# Patient Record
Sex: Male | Born: 1980 | Marital: Single | Smoking: Former smoker
Health system: Northeastern US, Community
[De-identification: ages and names within clinical notes are randomized; demographics above are authoritative.]

## PROBLEM LIST (undated history)

## (undated) DIAGNOSIS — H31009 Unspecified chorioretinal scars, unspecified eye: Secondary | ICD-10-CM

## (undated) DIAGNOSIS — H179 Unspecified corneal scar and opacity: Secondary | ICD-10-CM

## (undated) DIAGNOSIS — B5801 Toxoplasma chorioretinitis: Secondary | ICD-10-CM

## (undated) DIAGNOSIS — H3320 Serous retinal detachment, unspecified eye: Secondary | ICD-10-CM

## (undated) DIAGNOSIS — H40059 Ocular hypertension, unspecified eye: Principal | ICD-10-CM

## (undated) HISTORY — DX: Ocular hypertension, unspecified eye: H40.059

## (undated) HISTORY — DX: Unspecified chorioretinal scars, unspecified eye: H31.009

## (undated) HISTORY — PX: RUPTURED GLOBE EXPLORATION AND REPAIR: SHX2366

## (undated) HISTORY — PX: FINGER SURGERY: SHX640

## (undated) HISTORY — DX: Unspecified corneal scar and opacity: H17.9

---

## 2000-11-09 ENCOUNTER — Emergency Department (HOSPITAL_BASED_OUTPATIENT_CLINIC_OR_DEPARTMENT_OTHER): Payer: Self-pay | Admitting: Emergency Medicine

## 2003-11-01 ENCOUNTER — Emergency Department (HOSPITAL_BASED_OUTPATIENT_CLINIC_OR_DEPARTMENT_OTHER): Payer: Self-pay | Admitting: Emergency Medicine

## 2003-11-02 LAB — CT ORBITS WO CONTRAST

## 2003-11-02 LAB — CT CORONAL VIEW-ADDITIONAL VIE

## 2004-03-06 ENCOUNTER — Other Ambulatory Visit: Payer: Self-pay | Admitting: Advanced Practice Midwife

## 2004-03-17 LAB — CYSTIC FIBROSIS DNA ANALYSIS: CYSTIC FIBROSIS DNA ANALYSIS: NEGATIVE

## 2006-08-12 ENCOUNTER — Encounter (HOSPITAL_BASED_OUTPATIENT_CLINIC_OR_DEPARTMENT_OTHER): Payer: Self-pay

## 2009-12-15 ENCOUNTER — Encounter (HOSPITAL_BASED_OUTPATIENT_CLINIC_OR_DEPARTMENT_OTHER): Payer: Self-pay

## 2009-12-15 ENCOUNTER — Ambulatory Visit (HOSPITAL_BASED_OUTPATIENT_CLINIC_OR_DEPARTMENT_OTHER): Payer: Self-pay | Admitting: Ophthalmology

## 2009-12-15 ENCOUNTER — Emergency Department (HOSPITAL_BASED_OUTPATIENT_CLINIC_OR_DEPARTMENT_OTHER)
Admission: RE | Admit: 2009-12-15 | Disposition: A | Discharge status: Home / Self Care | Payer: Self-pay | Source: Emergency Department | Attending: Emergency Medicine | Admitting: Emergency Medicine

## 2009-12-15 DIAGNOSIS — H209 Unspecified iridocyclitis: Secondary | ICD-10-CM

## 2009-12-15 DIAGNOSIS — S0590XA Unspecified injury of unspecified eye and orbit, initial encounter: Secondary | ICD-10-CM

## 2009-12-15 NOTE — ED Notes (Signed)
Pt reports L eye injury 5 years ago requiring surgery, now with pain to that area withblurry vision

## 2009-12-15 NOTE — Nursing Note (Signed)
>>   Sean Chase     Thu Dec 15, 2009  2:50 PM  S/p  Nail injury to OS 5 years ago.    >> [name] Dec 15, 2009  2:23 PM  Here for emergency 3 days ago pain started in OS, pain starts slowly and progresses to (10). +Sensitive to light,red. Pt had surgery 41yrs ago on OS. Had cut inside eye that was repaired surgically.

## 2009-12-15 NOTE — Discharge Instructions (Signed)
Go now to the eye clinic for further evaluation.  Return for any acute problems.

## 2009-12-15 NOTE — Patient Instructions (Signed)
[facility]    Use pred forte one drop every hour while awake.    Use cosopt one drop in the left eye 2 times per day    Use xalatan one drop in the left eye at bedtime    See me tomorrow afternoon.

## 2009-12-15 NOTE — Progress Notes (Signed)
[facility] Sean Chase was seen for a first eye exam.  He has a history of a nail injury/ruptured globe 5 years ago.  He is c.o. Pain in the left eye and light sensitivity.    On exam today he has a healed corneal laceration on the left eye.  There are iridocorneal adhesion.  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  He will start pred forte 1 gtt OS q 1 hours while awake.  His intraocular pressure is elevated likely due to the inflammatory cells clogging the trabecular meshwork.  He will start cosopt 1 gtt OS bid and use xalatan 1 gtt OS qhs.    He has a lesion on his retina that may be a nonpigmented scar.  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    He will rtc tomorrow afternoon.

## 2009-12-16 ENCOUNTER — Ambulatory Visit (HOSPITAL_BASED_OUTPATIENT_CLINIC_OR_DEPARTMENT_OTHER): Payer: Self-pay | Admitting: Ophthalmology

## 2009-12-16 DIAGNOSIS — S0590XA Unspecified injury of unspecified eye and orbit, initial encounter: Secondary | ICD-10-CM

## 2009-12-16 MED ORDER — PREDNISOLONE ACETATE 1 % OP SUSP
1.0000 [drp] | OPHTHALMIC | Status: AC
Start: 2009-12-16 — End: 2010-03-18

## 2009-12-16 NOTE — Nursing Note (Signed)
>>   Sean Chase,OT     Fri Dec 16, 2009  2:17 PM  1 day F/U Iritis OS.  No pain today.  No complains.

## 2009-12-16 NOTE — Progress Notes (Signed)
Sean Chase was seen for follow up of iritis OS.  He has a hx. Of a nail injury/ruptured globe OS.  He feels much better today.  He will continue to use pred forte q 1 hour, cosopt bid, and xalatan qhs.  He will follow up with me in 5-6 days.

## 2009-12-16 NOTE — Patient Instructions (Signed)
Continue to use all three eye drops as directed.

## 2009-12-16 NOTE — Progress Notes (Addendum)
Addended by: Patton Salles on: 12/16/2009      Modules accepted: Orders

## 2009-12-17 LAB — EMERGENCY ROOM NOTE

## 2009-12-22 ENCOUNTER — Ambulatory Visit (HOSPITAL_BASED_OUTPATIENT_CLINIC_OR_DEPARTMENT_OTHER): Payer: Self-pay | Admitting: Ophthalmology

## 2009-12-22 DIAGNOSIS — B5801 Toxoplasma chorioretinitis: Secondary | ICD-10-CM

## 2009-12-22 DIAGNOSIS — S0590XA Unspecified injury of unspecified eye and orbit, initial encounter: Secondary | ICD-10-CM

## 2009-12-22 DIAGNOSIS — H209 Unspecified iridocyclitis: Secondary | ICD-10-CM

## 2009-12-22 NOTE — Progress Notes (Signed)
[facility] Sean Chase was seen for follow up.  He has a history of a ruptured globe OS from a nail injury. On exam today his iritis has worsened despite aggressive topical steroid use.  He now has a vitiritis which blurs the view of the retina somewhat.  The creamy white retinal lesion is still visible.  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    I will refer him to a retina specialist at [facility] for additional evaluation.    His intraocular pressure is elevated due to the inflammatory cells in the A/c.  He will restart cosopt 1 gtt OD bid and xalatan 1 gtt OS qhs.

## 2009-12-22 NOTE — Nursing Note (Signed)
>>   Sean Chase     Thu Dec 22, 2009  3:02 PM  He denies head ache or eye pain    >> Dzenana Idrizovic, OD     Thu Dec 22, 2009  2:37 PM  Here for 6 day f/u on eye trauma OS and iritis from it.   Pt states eye feels a lot better now.     Currently taking Pred forte Q1hr OS only.    Neg eye pain or any other eye complaints.  No photophobia.    Here is the summary of the last notes:    Sean Chase  12/16/09 02:40 PM  Signed  Sula Soda was seen for follow up of iritis OS.  He has a hx. Of a nail injury/ruptured globe OS.  He feels much better today.  He will continue to use pred forte q 1 hour, cosopt bid, and xalatan qhs.  He will follow up with me in 5-6 days.    Sean Chase  12/16/09 02:44 PM  Addended  Addended by: Sean Chase on: 12/16/2009

## 2009-12-28 ENCOUNTER — Encounter (HOSPITAL_BASED_OUTPATIENT_CLINIC_OR_DEPARTMENT_OTHER): Payer: Self-pay

## 2009-12-28 ENCOUNTER — Emergency Department (HOSPITAL_BASED_OUTPATIENT_CLINIC_OR_DEPARTMENT_OTHER)
Admission: RE | Admit: 2009-12-28 | Disposition: A | Discharge status: Home / Self Care | Payer: Self-pay | Source: Emergency Department | Attending: Emergency Medicine | Admitting: Emergency Medicine

## 2009-12-28 LAB — EMERGENCY ROOM NOTE

## 2009-12-28 NOTE — ED Notes (Signed)
C/o pain and seeing circles left eye x two days. Seen in ED for same three weeks ago Told he had high pressure and infection in eye

## 2009-12-28 NOTE — Discharge Instructions (Signed)
Please continue current medications.     Follow up with retinal specialist at [facility] in 2 days as scheduled.

## 2010-03-31 HISTORY — PX: RETINAL DETACHMENT SURGERY: SHX105

## 2010-11-30 DIAGNOSIS — B5801 Toxoplasma chorioretinitis: Secondary | ICD-10-CM | POA: Insufficient documentation

## 2011-03-19 ENCOUNTER — Encounter (HOSPITAL_BASED_OUTPATIENT_CLINIC_OR_DEPARTMENT_OTHER): Payer: Self-pay

## 2011-03-19 ENCOUNTER — Ambulatory Visit (HOSPITAL_BASED_OUTPATIENT_CLINIC_OR_DEPARTMENT_OTHER): Payer: PRIVATE HEALTH INSURANCE

## 2011-03-19 DIAGNOSIS — Z717 Human immunodeficiency virus [HIV] counseling: Secondary | ICD-10-CM

## 2011-03-19 LAB — ICTR HIV POC RAPID: HIV 1 & 2 BY ORAQUICK: NONREACTIVE

## 2011-03-21 LAB — ICTR RPR: RPR: NONREACTIVE

## 2011-03-21 LAB — ICTR CHLAMYDIA/GONORRHEA
CHLAMYDIA NUCLEIC ACID AMPLIFI: NEGATIVE
GONOCOCCUS NUCLEIC ACID AMPLIF: NEGATIVE

## 2011-03-28 NOTE — Progress Notes (Signed)
--  Subjective: History:      Allergies: Review of patient's allergies indicates no known allergies.    Specific reason for testing: Pt has unprotected oral and vaginal sex with male partner.      -Disc:  Transmission (Blood, Semen, Vaginal Fluid and Breastmilk), Risky Behaviors, Non-Risks/Myths,  Seroconversion Period , Result Waiting Time, Possible Results, Feelings Surrounding Possible Results, Plans for Informing of Results, Support Structures and Hep C Risk.     Risk Assessment Completed:  Yes  Piercing/Tattoo: No  Known Partner Risks: NO  IV Drug Use: no    Sexual History: Heterosexual    Current Partner: No    # of partners (Last Year): 3+    Sexual Partners: Female    Sexual Practices: Vaginal, Oral    Condom Use: Sometimes    Condom Breakage: No    Condom Instruction Given: Yes    Risk Reduction Plans: Use condoms more often.    --Objective:    HX of STDs: (dx/tx)  Tested Before: No    DV Assessment Completed: Yes    Last Poss. Exposure: 1 week ago (same partner in the last 2 months)    Protection used: No    Seroconversion Period Explained: Yes    Testing Options Chosen  Readiness to test:   Still want to test?   yes    Patient chose to be tested for: HIV, Chlamydia, GC and Syphilis    Test sent to: State lab    Type of test: Blood, Urine    --Assessment: HIV, HCV, and STI Education and Risk Reduction      --Plan: Education Counseling and Referrals    Informed Consent signed for HIV Testing  STD/HIV risk assessment done  STD/HIV prevention/safer sex discussed  Condoms offered and  Distributed  Confidentiality  Clinic contact number both day and after hours/emergency  Handouts given      Follow Up Plan: Pt tested Non-Reactive for HIV Rapid Test Today.

## 2011-04-09 ENCOUNTER — Ambulatory Visit (HOSPITAL_BASED_OUTPATIENT_CLINIC_OR_DEPARTMENT_OTHER): Payer: Self-pay | Admitting: Ophthalmology

## 2011-04-09 ENCOUNTER — Encounter (HOSPITAL_BASED_OUTPATIENT_CLINIC_OR_DEPARTMENT_OTHER): Payer: Self-pay | Admitting: Internal Medicine

## 2011-04-09 ENCOUNTER — Emergency Department (HOSPITAL_BASED_OUTPATIENT_CLINIC_OR_DEPARTMENT_OTHER)
Admission: RE | Admit: 2011-04-09 | Disposition: A | Discharge status: Home / Self Care | Payer: Self-pay | Source: Emergency Department | Attending: Internal Medicine | Admitting: Internal Medicine

## 2011-04-09 ENCOUNTER — Encounter (HOSPITAL_BASED_OUTPATIENT_CLINIC_OR_DEPARTMENT_OTHER): Payer: Self-pay | Admitting: Ophthalmology

## 2011-04-09 DIAGNOSIS — H179 Unspecified corneal scar and opacity: Secondary | ICD-10-CM

## 2011-04-09 DIAGNOSIS — H31009 Unspecified chorioretinal scars, unspecified eye: Secondary | ICD-10-CM

## 2011-04-09 DIAGNOSIS — S0590XA Unspecified injury of unspecified eye and orbit, initial encounter: Secondary | ICD-10-CM

## 2011-04-09 DIAGNOSIS — H40059 Ocular hypertension, unspecified eye: Secondary | ICD-10-CM

## 2011-04-09 HISTORY — DX: Ocular hypertension, unspecified eye: H40.059

## 2011-04-09 HISTORY — DX: Serous retinal detachment, unspecified eye: H33.20

## 2011-04-09 HISTORY — DX: Unspecified chorioretinal scars, unspecified eye: H31.009

## 2011-04-09 HISTORY — DX: Unspecified corneal scar and opacity: H17.9

## 2011-04-09 MED ORDER — DORZOLAMIDE HCL-TIMOLOL MAL 2-0.5 % OP SOLN
1.00 [drp] | Freq: Two times a day (BID) | OPHTHALMIC | Status: AC
Start: 2011-04-09 — End: 2012-04-08

## 2011-04-09 MED ORDER — BRIMONIDINE TARTRATE 0.15 % OP SOLN
1.00 [drp] | Freq: Two times a day (BID) | OPHTHALMIC | Status: AC
Start: 2011-04-09 — End: 2012-04-08

## 2011-04-09 NOTE — ED Triage Note (Signed)
C/o headache and pain in left eye.  [place].  Vision difficulty since surgery on left eye last August.

## 2011-04-09 NOTE — ED Notes (Signed)
Patient Disposition: Discharge to Ophthalmologist office    Patient education for diagnosis (visual loss), medications (none), activity (as tolerated- no strenuous activity), diet (regular) and follow-up- opthalmology directly from er.  Patient left ED 1:35 PM.  Patient  received written instructions.  Interpreter to provide instructions: Yes- video interpreter    Discharged to: Ophthalmologist office

## 2011-04-09 NOTE — ED Provider Notes (Signed)
The patient was seen primarily by me. ED nursing record was reviewed. Prior records as available electronically through the Epic record were reviewed.    History, physical exam and disposition were conducted with an official hospital [place] interpreter.    HPI:    This 30 year old male patient presents "pain in head and vision change"  Onset 3 weeks ago of blurred vision in left eye.  States his vision becomes dark and then sees white field and no definition.     Denies eye trauma, photophobia, foreign body sensation, contacts, glasses.   Hx of nail injury with ruptured globe, iritis, toxoplasmosis in eye, retinal detachment, al OS.  Not taking any eye drops currently.   States seen at [facility], last visit one year ago.     ROS: Pertinent positives were reviewed as per the HPI above. All other systems were reviewed and are negative.      Past Medical History/Problem list:    Past Medical History    Retinal detachment        Patient Active Problem List:     Trauma to eye [921.9AG]            Past Surgical History: History reviewed.  No pertinent past surgical history.      Medications:   No current facility-administered medications on file prior to encounter.  Current outpatient prescriptions ordered prior to encounter:  dorzolamide-timolol (COSOPT) 22.3-6.8 MG/ML ophthalmic solution One drop in the left eye, twice/day Disp:  Rfl:    latanoprost (XALATAN) 0.005 % ophthalmic solution One drop in the left eye at bedtime Disp:  Rfl:            Social History:   Social History   Marital Status: Single  Spouse Name: N/A    Years of Education: N/A  Number of Children: N/A     Occupational History  None on file     Social History Main Topics   Smoking status: Current Everyday Smoker  0.2 Packs/Day     Smokeless tobacco:     Alcohol Use: Yes    Comment: social    Drug Use: No    Sexually Active:      Other Topics Concern   None on file     Social History Narrative   None on file         Allergies:  Review of  Patient's Allergies indicates:  No Known Allergies      Physical Exam:  BP 140/86   Pulse 71   Temp 98.5 F   Resp 18   Wt 74.844 kg   SpO2 97%    GENERAL:  WDWN, no acute distress, non-toxic   SKIN:  Warm & Dry, no rash, no petechia.  HEAD:  NCAT. Sclerae are anicteric and aninjected, oropharynx is clear with moist mucous membranes. PERRL. EOMI. B TMs clear.  OS Scarring of iris. EOM Intact.   OD WNL.   NECK:  No C-spine tenderness, crepitus, step-off.  No meningismus.  No LAN. No stridor.  LUNGS:  Clear to auscultation bilaterally. No wheezes, rales, rhonchi.   HEART:  RRR.  No murmurs, rubs, or gallops.   ABDOMEN:  Soft, NTND.  No hepatosplenomegaly.  No masses.  No involuntary guarding or rebound.   EXTREMITIES:  No obvious deformities.  Warm and well perfused.  No cyanosis, clubbing, or edema.   GENITOURINARY:  No CVA tenderness.   NEUROLOGIC:  Alert and oriented x4; moves all extremities well; speaking  in clear fluent sentences. Normal gait without ataxia; nonfocal. CNsII-XII symmetrical and intact. Sensation intact to light touch throughout. 5/5 strength globally.  PSYCHIATRIC:  Appropriate for age, time of day, and situation        ED Course and Medical Decision-making:  30 year old male with hx of traumatic injury to left eye, iritis, retinal detachment and toxoplasmosis.   Decreasing vision in left eye over past month.   OS vision change here in ED concerning for acute vision loss.   Appropriate for him to have an urgent eye evaluation today in the eye clinic.       Reasons to return to the ED were reviewed in detail. The patient agrees with this plan and disposition.      Condition on Discharge: Improved and Stable        Diagnosis/Diagnoses:  Acute Visual Loss

## 2011-04-09 NOTE — ED Notes (Signed)
Visual acuity:  Left eye- pt states all he can see is white.  No letters.  Right eye 20/20 -1.  Both eyes 20/20.

## 2011-04-09 NOTE — Progress Notes (Signed)
Unfortunate 30 year old male, with history of trauma OS, ruptured globe, 6 years ago, with limited vision OS, but developed separate problem one year ago, ocular toxoplasmosis OS, chorioretinitis, apparently developed retinal detachment. He had retinal detachment surgery, presumably with silicone oil at [facility], but didn't follow up. He now has pain, and very little vision, and high intraocular pressure OS. There is silicone oil layered superiorly in anterior chamber, and in vitreous. He needs to see retina specialist at [facility] to consider removal of silicone oil. Also start cosopt one drop, left eye, twice a day, and alphagan, one drop, twice a day.     Corneal scar, OD. He had additional injury OD playing soccer. No specific therapy, but should use safety glasses.

## 2011-04-09 NOTE — Nursing Note (Signed)
>>   ELANA [facility]     Mon Apr 09, 2011  2:10 PM  Pt here for emergency. Sent immediately from ED.  C/o Pain in eye (rated as 5) "pain comes and goes.", OS X 1 day. Head also hurts. Blurry VA OS as well.  (+) Floaters OS  (-)Flashes    H/O: nail injury/ruptured globe 5 years ago, had Sx to correct it.  F/u iritis, corneal laceration & scar OS.

## 2011-04-25 ENCOUNTER — Encounter (HOSPITAL_BASED_OUTPATIENT_CLINIC_OR_DEPARTMENT_OTHER): Payer: Self-pay

## 2011-04-25 DIAGNOSIS — F17200 Nicotine dependence, unspecified, uncomplicated: Secondary | ICD-10-CM | POA: Insufficient documentation

## 2012-05-20 ENCOUNTER — Other Ambulatory Visit (HOSPITAL_BASED_OUTPATIENT_CLINIC_OR_DEPARTMENT_OTHER): Payer: Self-pay | Admitting: Ophthalmology

## 2012-05-22 ENCOUNTER — Telehealth (HOSPITAL_BASED_OUTPATIENT_CLINIC_OR_DEPARTMENT_OTHER): Payer: Self-pay

## 2012-05-26 ENCOUNTER — Encounter (HOSPITAL_BASED_OUTPATIENT_CLINIC_OR_DEPARTMENT_OTHER): Payer: Self-pay | Admitting: Emergency Medicine

## 2012-05-26 ENCOUNTER — Emergency Department (HOSPITAL_BASED_OUTPATIENT_CLINIC_OR_DEPARTMENT_OTHER)
Admission: RE | Admit: 2012-05-26 | Disposition: A | Discharge status: Home / Self Care | Payer: Self-pay | Source: Emergency Department | Attending: Emergency Medicine | Admitting: Emergency Medicine

## 2012-05-26 HISTORY — DX: Toxoplasma chorioretinitis: B58.01

## 2012-05-26 LAB — SCREENING TEST VISUAL ACUITY QUANTITATIVE BILAT

## 2012-05-26 MED ORDER — TETRACAINE HCL 0.5 % OP SOLN
1.0000 [drp] | Freq: Once | OPHTHALMIC | Status: DC
Start: 2012-05-26 — End: 2012-05-26

## 2012-05-26 MED ORDER — BRIMONIDINE TARTRATE 0.15 % OP SOLN
1.0000 [drp] | Freq: Three times a day (TID) | OPHTHALMIC | Status: DC
Start: 2012-05-26 — End: 2012-05-26

## 2012-05-26 MED ORDER — DORZOLAMIDE HCL-TIMOLOL MAL 2-0.5 % OP SOLN
1.00 [drp] | Freq: Two times a day (BID) | OPHTHALMIC | Status: AC
Start: 2012-05-26 — End: 2012-06-02

## 2012-05-26 MED ORDER — TIMOLOL MALEATE 0.5 % OP SOLN
1.00 [drp] | Freq: Once | OPHTHALMIC | Status: AC
Start: 2012-05-26 — End: 2012-05-26
  Administered 2012-05-26: 1 [drp] via OPHTHALMIC
  Filled 2012-05-26: qty 5

## 2012-05-26 MED ORDER — ACETAZOLAMIDE ER 500 MG PO CP12
500.0000 mg | ORAL_CAPSULE | Freq: Once | ORAL | Status: DC
Start: 2012-05-26 — End: 2012-05-26

## 2012-05-26 MED ORDER — BRIMONIDINE TARTRATE 0.15 % OP SOLN
1.00 [drp] | Freq: Three times a day (TID) | OPHTHALMIC | Status: AC
Start: 2012-05-26 — End: 2012-06-02

## 2012-05-26 MED ORDER — DORZOLAMIDE HCL-TIMOLOL MAL 2-0.5 % OP SOLN
1.0000 [drp] | Freq: Two times a day (BID) | OPHTHALMIC | Status: DC
Start: 2012-05-26 — End: 2012-05-26

## 2012-05-26 MED ORDER — TETRACAINE HCL 0.5 % OP SOLN
1.00 [drp] | Freq: Once | OPHTHALMIC | Status: AC
Start: 2012-05-26 — End: 2012-05-26
  Administered 2012-05-26: 1 [drp] via OPHTHALMIC
  Filled 2012-05-26: qty 15

## 2012-05-26 NOTE — ED Notes (Signed)
Patient Disposition    Patient education for diagnosis, medications, activity, diet and follow-up.  Patient left ED 7:27 PM.  Patient rep received written instructions.  Interpreter to provide instructions: Yes    Discharged to: Discharged to home. Patient given extensive verbal and written instruction to follow up with [facility] tomorrow morning.

## 2012-05-26 NOTE — ED Provider Notes (Signed)
The patient was seen by me and supervised by Dr. Max Fickle. ED nursing record was reviewed. Prior records as available electronically through the Epic record were reviewed.    History, physical exam and disposition were conducted with an official hospital [place] interpreter.    HPI:    Sean Chase is a 31 year old male w/ a PMH of toxoplasmosis chorioretinitis of the L eye p/w L eye redness and associated headache. The pt states that he ran out of his two opthalmic solutions today and that his L eye has become red and painful and he has developed a L sided headache. He denies any N/V/D, lightheadeness, visual disturbances, or neck pain. He has no weakness or numbness anywhere. He states that this happens whenever he runs out of his opthalmic medications and that he would like to have them refilled.      ROS: Pertinent positives were reviewed as per the HPI above. All other systems were reviewed and are negative.      Past Medical History/Problem list:    Past Medical History    Toxoplasmosis chorioretinitis of left eye      There is no problem list on file for this patient.        Past Surgical History: No past surgical history on file.      Medications:   No current facility-administered medications on file prior to encounter.  No current outpatient prescriptions on file prior to encounter.      Social History:   Social History   Marital Status: Single  Spouse Name: N/A    Years of Education: N/A  Number of Children: N/A     Occupational History  None on file     Social History Main Topics   Smoking status: Not on file    Smokeless tobacco:     Alcohol Use: Not on file    Drug Use: Not on file    Sexually Active: Not on file     Other Topics Concern   None on file     Social History Narrative   None on file     The patient lives at home. The patient is currently employed.      Allergies:  Review of Patient's Allergies indicates:  No Known Allergies      Physical Exam:  BP 149/80  Pulse 74  Temp(Src)  98.4 F  Resp 14  Wt 81.647 kg  SpO2 100%    GENERAL:  WDWN, no acute distress, non-toxic   SKIN:  Warm & Dry, no rash, no petechia.  HEAD:  NCAT. Sclerae are anicteric and aninjected, oropharynx is clear with moist mucous membranes. PERRL. R eye is normal w/ visual acuity of 30/20, L eye is injected w/ opacity behind the lens, w/ no visual acuity. No discharge from either eye. EOMI. B TMs clear.  NECK:  No C-spine tenderness, crepitus, step-off.  No meningismus.  No LAN. No stridor.  LUNGS:  Clear to auscultation bilaterally. No wheezes, rales, rhonchi.   HEART:  RRR.  No murmurs, rubs, or gallops.   ABDOMEN:  Soft, NTND.  No hepatosplenomegaly.  No masses.  No involuntary guarding or rebound.   EXTREMITIES:  No obvious deformities.  Warm and well perfused.  No cyanosis, clubbing, or edema.   GENITOURINARY:  No CVA tenderness.   NEUROLOGIC:  Alert and oriented x4; moves all extremities well; speaking in clear fluent sentences. Normal gait without ataxia; nonfocal. CNsII-XII symmetrical and intact. Sensation intact to light touch  throughout. 5/5 strength globally.  PSYCHIATRIC:  Appropriate for age, time of day, and situation      ED Course and Medical Decision-making:    Sean Chase is a 31 year old male w/ a PMH of toxoplasmosis chorioretinitis p/w 1 day of L eye pain and headache. The pt's history and physical was very concerning for acute angle glaucoma, and ocular tonometry was performed w/ a pressure of 20 mmHg in the R eye and 44 mmHg in the left. Visual acuity testing revealed an acuity of 30/20 in the R eye, and no vision in the left. His opthomologist at [facility] was contacted and an appointment was set up for tomorrow morning. He was also written for a refill of his opthalmic medications.    Reasons to return to the ED were reviewed in detail. The patient agrees with this plan and disposition.      Condition on Discharge: Improved and Stable  Condition on Admission:  Stable      Diagnosis/Diagnoses:  No diagnosis found.        =======================    Farrel Conners, MD  PGY 1, TY Intern, Pager: [phone]  5:39 PM 05/26/2012

## 2012-05-26 NOTE — ED Notes (Signed)
MD at bedside to test eye pressure.

## 2012-05-26 NOTE — ED Provider Notes (Signed)
Attending Note    This patient was seen with the resident.  I agree with the notes, the physical exam, the disposition and plan.  I oversaw the patient along with the resident.      The patient comes to the emergency department is a 31 year old male with a history of toxoplasmosis induced retinal vasculitis 4 years ago.  He has been on timolol and alphagan drops prescribed by Dr. Nathaneil Canary per the patient.  He was last seen in [facility] ophthalmology one year ago he states, by Dr. Nathaneil Canary.  He has not seen an ophthalmologist since that time.  He states that he's been having increasing pain, corneal clouding, and decreased vision of the left eye, though states he has extremely poor vision at baseline.  He denies any injury to the eye.  He states is also a headache associated with the pain.  No fevers or chills.  He attempted to contact ophthalmology however, he states he was on hold for too long and he decided to go to the emergency department.    Exam reveals a well-appearing male, no acute distress.  Left eye is injected, no chemosis.  Extraocular motions intact.  Pupils equal and reactive bilaterally.  There is some corneal clouding appreciated.  No periorbital edema.  No tenderness over the temporal area.  Contralateral eye is unaffected and normal.     Tonometry-  R- 20  L- 44    The patient comes in with the above history and physical exam.  He has increased pressure over the left eye, though this is likely chronic, though given the symptomology this could be acute on chronic concerning for acute closure glaucoma.  Visual acuity, although the patient wasn't able to perform this, and he states that his baseline vision is extremely poor he normally would not be able to do this.  We contacted ophthalmology on-call, Dr. Raford Pitcher, and after discussion with her, she stated that the patient should followup with the ophthalmology clinic tomorrow morning with herself, and to give the patient Timolol in the emergency  department and refill his prescriptions which would certainly help. She stated that he likely has increased pressures at baseline, and getting him back on his medications is the appropriate treatment, which will decrease the pressures anyway.  I tried to give the patient acetazolamide however, we  Do not have this medication in stock in the emergency department.  The patient was given instructions, medications, and prescriptions for his medications.  He will followup with the ophthalmology clinic as discussed.  Was given reasons to return to the Emergency Department.      Jennette Bill, MD    Total critical care time is 30 minutes outside of separately billable procedures. Time included the complexity of the case; directly caring for the patient; interpreting labwork, EKG, and chest x-ray; discussing with intensivist/hospitalist; and documenting the chart.

## 2012-05-26 NOTE — Discharge Instructions (Signed)
Your eye pain and headache are most likely due to your toxoplasmosis infection. We have set up an appointment for you at the eye clinic at [facility] for tomorrow morning. It is very important that you come to this appointment so that your eye can be evaluated. Return to the ED if your pain becomes worse.      Sua dor nos olhos e dor de cabea so provavelmente devido a sua infeco por toxoplasmose. Crimos um compromisso para voc na clnica de olho [facility].  muito importante que voc vir a este encontro para que seu olho pode ser [facility]. Voltar para a ED se a dor se torna pior.

## 2012-05-26 NOTE — ED Triage Note (Signed)
Patient states that he has had a headache for one week. Hx of toxoplasmosis and related surgery to left eye. Patient has been using his timolol eye drops with relief of pain.

## 2012-05-27 ENCOUNTER — Ambulatory Visit (HOSPITAL_BASED_OUTPATIENT_CLINIC_OR_DEPARTMENT_OTHER): Payer: Self-pay | Admitting: Ophthalmology

## 2012-05-27 ENCOUNTER — Encounter (HOSPITAL_BASED_OUTPATIENT_CLINIC_OR_DEPARTMENT_OTHER): Payer: Self-pay | Admitting: Ophthalmology

## 2012-05-27 ENCOUNTER — Telehealth (HOSPITAL_BASED_OUTPATIENT_CLINIC_OR_DEPARTMENT_OTHER): Payer: Self-pay | Admitting: Registered Nurse

## 2012-05-27 DIAGNOSIS — H268 Other specified cataract: Secondary | ICD-10-CM

## 2012-05-27 DIAGNOSIS — H179 Unspecified corneal scar and opacity: Secondary | ICD-10-CM

## 2012-05-27 DIAGNOSIS — B5801 Toxoplasma chorioretinitis: Secondary | ICD-10-CM

## 2012-05-27 DIAGNOSIS — H40059 Ocular hypertension, unspecified eye: Secondary | ICD-10-CM

## 2012-05-27 NOTE — Nursing Note (Signed)
>>   Marylou Mccoy     Tue May 27, 2012  8:19 PM  Patient's pain seemed to increase with cessation of eyedrops.  He has not been using them regularly and ran out last week.  Eye is significantly more comfortable with anti glaucomatous medication instilled last night in the emergency room.    >> Charolotte Capuchin     Tue May 27, 2012  8:38 AM  pt here for emergency visit was seen in ED yesterday.  C/o pain OS x 1 wk. Pain 10 pt states after putting in eye drop pain decreases to 2 . 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 VA is cloudy with white in center of eye x 1 mos. VA worse can see only cloudy unable to distinguish objects. ED added Timolol 0.5% yesterday.    ED note is under U# [id number] [facility] but is same pt.      Ocular hypertension         Trauma to eye        Corneal scar         Chorioretinal scar      Retinal detachment OS with surgery at [facility]    H/O ocular toxoplasmosis OS

## 2012-05-27 NOTE — Progress Notes (Signed)
Impression:  Blind painful left eye:  Patient with a penetrating nail injury OS 7 years ago that was repaired a with good visual result despite full thickness corneal scarring.  Two years ago the patient developed fulminant inflammation in the eye secondary to toxoplasmosis chorioretinitis with a subsequent traction retinal detachment. An attempt to repair the detachment was made at [facility] by Dr Carollee Leitz, however the patient never returned for post operative care.  The vision remained poor post operatively and pain developed.  The patient was seen by Dr. Francee Nodal one year ago and found to have uncontrolled intraocular pressure with silicone throughout the anterior chamber.  He was referred back to Dr. Carollee Leitz at [facility] but never went.  He was also placed on ocular antihypertensive medication which he has taken intermittently to control his pain.  He ran out of medication and has noticed that his vision seems to be worsening and the pain has been increasing as well.  Pain has improved markedly since he has resumed his glaucoma medication suggesting that a significant component of his discomfort is from uncontrolled intraocular pressure.  Mature cataract OS:  Lens has become opaque over the past year which most certainly has impacted the vision negatively. Through the lens changes it is impossible to determine whether there are additional retinal complications affecting the vision (active retinitis, recurrent retinal detachment, etc)   Toxoplasmosis chorioretinitis  OS:  No view of the posterior pole OS through mature lens.  Mild anterior segment inflammation however, suggesting no fulminant reactivation of the condition.  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  Corneal scar OD:  Old soccer injury.  Minimally visually significant    Plan:  Resume Alphagan P .15% OS  bid and Cosopt OS bid  Discontinue Timoptic XE .25% when Cosopt resumed.  Refer to Dr. Carollee Leitz for further evaluation and management.    Poor prognosis discussed.

## 2012-05-27 NOTE — Progress Notes (Signed)
Unable to reach patient, phone not in service  Calling to check on patient after recent ED visit/hospitalization   Never seen here in our clinic  Will ask FD to reach out and establish with PCP

## 2012-05-28 ENCOUNTER — Telehealth (HOSPITAL_BASED_OUTPATIENT_CLINIC_OR_DEPARTMENT_OTHER): Payer: Self-pay | Admitting: Allergy

## 2012-05-28 NOTE — Progress Notes (Signed)
Unable to leave message. Phone number in chart not in service. Needs to establish care.

## 2012-06-17 ENCOUNTER — Ambulatory Visit (HOSPITAL_BASED_OUTPATIENT_CLINIC_OR_DEPARTMENT_OTHER): Payer: Self-pay | Admitting: Ophthalmology

## 2012-11-14 ENCOUNTER — Ambulatory Visit (HOSPITAL_BASED_OUTPATIENT_CLINIC_OR_DEPARTMENT_OTHER): Payer: PRIVATE HEALTH INSURANCE | Admitting: Family Medicine

## 2013-03-10 ENCOUNTER — Inpatient Hospital Stay (HOSPITAL_BASED_OUTPATIENT_CLINIC_OR_DEPARTMENT_OTHER)
Admission: RE | Admit: 2013-03-10 | Disposition: A | Discharge status: Home / Self Care | Payer: Self-pay | Source: Emergency Department | Attending: Internal Medicine | Admitting: Internal Medicine

## 2013-03-10 ENCOUNTER — Encounter (HOSPITAL_BASED_OUTPATIENT_CLINIC_OR_DEPARTMENT_OTHER): Payer: Self-pay | Admitting: Emergency Medicine

## 2013-03-10 DIAGNOSIS — R000 Tachycardia, unspecified: Principal | ICD-10-CM | POA: Diagnosis present

## 2013-03-10 LAB — BASIC METABOLIC PANEL
ANION GAP: 11 mmol/L (ref 5–15)
BUN (UREA NITROGEN): 13 mg/dL (ref 7–18)
CALCIUM: 9.4 mg/dL (ref 8.5–10.1)
CARBON DIOXIDE: 28 mmol/L (ref 21–32)
CHLORIDE: 102 mmol/L (ref 98–107)
CREATININE: 0.9 mg/dL (ref 0.7–1.2)
ESTIMATED GLOMERULAR FILT RATE: >60 mL/min (ref >60)
Glucose Random: 102 mg/dL (ref 74–160)
POTASSIUM: 4.2 mmol/L (ref 3.5–5.1)
SODIUM: 141 mmol/L (ref 136–145)

## 2013-03-10 LAB — POC URINALYSIS
BILIRUBIN, URINE: NEGATIVE
GLUCOSE,URINE: NEGATIVE
KETONE, URINE: NEGATIVE
LEUKOCYTE ESTERASE: NEGATIVE
NITRITE, URINE: NEGATIVE
OCCULT BLOOD, URINE: NEGATIVE
PH URINE: 7 (ref 5.0–8.0)
PROTEIN, URINE: NEGATIVE
SPECIFIC GRAVITY, URINE: 1.02 (ref 1.003–1.030)
UROBILINOGEN URINE: 0.2 (ref 0.2–1.0)

## 2013-03-10 LAB — CBC, PLATELET & DIFFERENTIAL
ABSOLUTE BASO COUNT: 0 10*3/uL (ref 0.0–0.1)
ABSOLUTE EOSINOPHIL COUNT: 0.2 10*3/uL (ref 0.0–0.8)
ABSOLUTE IMM GRAN COUNT: 0 10*3/uL (ref 0.00–0.03)
ABSOLUTE LYMPH COUNT: 3 10*3/uL (ref 0.6–5.9)
ABSOLUTE MONO COUNT: 0.6 10*3/uL (ref 0.2–1.4)
ABSOLUTE NEUTROPHIL COUNT: 2.7 10*3/uL (ref 1.6–8.3)
BASOPHIL %: 0.3 % (ref 0.0–1.2)
EOSINOPHIL %: 3.1 % (ref 0.0–7.0)
HEMATOCRIT: 40.9 % (ref 40.1–51.0)
HEMOGLOBIN: 13.7 g/dL (ref 13.7–17.5)
IMMATURE GRANULOCYTE %: 0 % (ref 0.0–0.4)
LYMPHOCYTE %: 45.9 % (ref 15.0–54.0)
MEAN CORP HGB CONC: 33.5 g/dL (ref 31.0–37.0)
MEAN CORPUSCULAR HGB: 31 pg (ref 26.0–34.0)
MEAN CORPUSCULAR VOL: 92.5 fL (ref 80.0–100.0)
MEAN PLATELET VOLUME: 9 fL (ref 8.7–12.5)
MONOCYTE %: 8.7 % (ref 4.0–13.0)
NEUTROPHIL %: 42 % (ref 40.0–75.0)
PLATELET COUNT: 260 10*3/uL (ref 150–400)
RBC DISTRIBUTION WIDTH STD DEV: 40 fL (ref 35.1–46.3)
RBC DISTRIBUTION WIDTH: 12.2 % (ref 11.5–14.3)
RED BLOOD CELL COUNT: 4.42 M/uL — ABNORMAL LOW (ref 4.60–6.10)
WHITE BLOOD CELL COUNT: 6.4 10*3/uL (ref 4.0–11.0)

## 2013-03-10 LAB — URINE DRUG SCREEN 7 DRUGS
AMPHETAMINES URINE: NEGATIVE
BARBITURATES URINE: NEGATIVE
BENZODIAZEPINES URINE: NEGATIVE
CANNABINOIDS URINE: NEGATIVE
COCAINE METABOLITES URINE: NEGATIVE
ETHANOL URINE: NEGATIVE
OPIATES URINE: NEGATIVE

## 2013-03-10 LAB — TROPONIN I: TROPONIN I: <0.02 ng/mL (ref 0.00–0.04)

## 2013-03-10 MED ORDER — METOPROLOL TARTRATE 25 MG PO TABS
ORAL_TABLET | ORAL | Status: DC
Start: 2013-03-10 — End: 2013-03-11
  Filled 2013-03-10: qty 1

## 2013-03-10 MED ORDER — SODIUM CHLORIDE 0.9 % IV BOLUS
1000.00 mL | Freq: Once | INTRAVENOUS | Status: AC
Start: 2013-03-10 — End: 2013-03-10
  Administered 2013-03-10: 1000 mL via INTRAVENOUS

## 2013-03-10 MED ORDER — METOPROLOL TARTRATE 25 MG PO TABS
25.00 mg | ORAL_TABLET | Freq: Once | ORAL | Status: AC
Start: 2013-03-10 — End: 2013-03-10
  Administered 2013-03-10: 25 mg via ORAL

## 2013-03-10 NOTE — ED Notes (Signed)
Pt ambulated into ED  Pt GCS 15    Pt +c/o "burn in chest with not normal heart beat." per patient    Pt GCS 15. Ambulates with steady gait. Speech clear and appropriate.   No SOB. No chest pain. No palpitations. No nausea/vomit/dairrhea.   No urine complaints.     HISTORY- retinal detachment  MEDICATIONS- denies daily medications

## 2013-03-10 NOTE — ED Notes (Signed)
HR 80- 150 - sinus Tach at this time (MD Loreta [address] aware)   - when patient feels his heart "not beat right" he coughs and the HR returns back to NSR in 80-90.

## 2013-03-10 NOTE — ED Provider Notes (Signed)
eMERGENCY dEPARTMENT eNCOUnter    I have reviewed the ED nursing notes and prior records. I have reviewed the patient's past medical history/problem list, allergies, social history and medication list    CHIEF COMPLAINT    Patient presents with:    Epigastric Pain - RAPID HEART BEAT AND SORE THROAT / ID    Chest Pain    Cough      HPI    [address] 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  Patient reports for the last few hours he said a rapid heart beat, with some mild burning in his chest.  He denies any chest pain.  Denies any shortness of breath.  Denies any nausea or vomiting.  Denies any diarrhea.  Denies any urinary complaints.  He denies excess coffee or chocolate.  Denies any red bull or energy drinks.  Denies any drugs or cocaine.  Denies any fevers or chills.    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    RETINAL DETACHMENT SURGERY  7/11    Comment s/p retinal detachment surgery, in setting of ocular toxoplasmosis OS, done at [facility]       CURRENT MEDICATIONS    No current outpatient prescriptions on file.    ALLERGIES    Review of Patient's Allergies indicates:  No Known Allergies    FAMILY HISTORY    History reviewed. No pertinent family history.    SOCIAL HISTORY    Social History    Marital Status: Single              Spouse Name:                       Years of Education:                 Number of children:               Social History Main Topics    Alcohol Use: Yes                Comment: social    Drug Use: No              Sexual Activity:                       Social History Narrative    ** Merged History Encounter **             REVIEW OF SYSTEMS    all other systems were reviewed  and found to be negative    PHYSICAL EXAM      Vital Signs: BP 132/84  Pulse 91  Temp(Src) 98.3 F  Resp 16  Wt 82 kg  SpO2 100%     SpO2: normal at 100% on room air    Constitutional:  Well appearing in no acute distress    HENT:  Normocephalic, Atraumatic,  OP Clear, no exudates appreciated.      Neck: Normal range of motion. No meningismus.    Respiratory:  Clear to auscultation B/L.  No wheezes noted. No rales or rhonchi    Cardiovascular:  RRR. Nl s1, s2, no murmur, rubs or gallops    Abdominal:  Bowel sounds normal.  S/NT/ND.      Musculoskeletal:  Good range of motion in all joints, Strength 5/5 x 4 extremities    Skin:  Warm, Dry, No erythema, No rash.  Capillary Refill < 2 seconds      Neurological:  No focal deficits noted.   Moving all extremities. Normal sensation    Psych: No Homicidal or Suicidal Ideation      ED COURSE & MEDICAL DECISION MAKING    Pertinent Labs & Imaging studies reviewed.   Patient is a 32 year old with palpitations.  Differential diagnosis includes arrhythmia, anxiety, electrolyte abnormality, pneumothorax.  A chest x-ray, electrocardiogram, basic labs and urine.    Electrocardiogram is normal sinus rhythm at 88, normal intervals, no evidence of ST elevation or depression.    Chest x-ray shows no acute process    Locations emergency department multiple times he had runs of narrow complex tachycardia in the 130s and 140s, then stopped on its own.  He denied any chest pain with these symptoms.  He states that he feels the discomfort.    Labs Reviewed   CBC + PLT + AUTO DIFF - Abnormal; Notable for the following:     RED BLOOD CELL COUNT 4.42 (*)     All other components within normal limits   BASIC METABOLIC PANEL   URINE DRUG SCREEN 7 DRUGS   POC URINALYSIS   LAB ADD ON/WRITE IN TEST   TROPONIN I    Narrative:     TROPNIN ADDED BY  [place] C Antonia Culbertson AT 2224 ON 03-10-13.RM   spoke with cardiology recommended metoprolol 25 milligrams twice a day.    Given the fact the patient symptomatic when he gets tachycardic, patient will be admitted to the hospital for observation.  I spoke with the hospitalist, who will [facility].         CONDITION:  Stable    DISPOSITION  Tele admit to [facility]    FINAL IMPRESSION  Palpitations  Paroxysmal tachycardia      [place] Esparanza Krider, DO  Emergency Department Attending Physician  [facility]    This Emergency Department patient encounter note was created using voice-recognition software and in real time during the ED visit. Please excuse any typographical errors that have not been edited out.

## 2013-03-10 NOTE — ED Notes (Signed)
TELE admit - palpitations & paroxysmal tachycardia, unspecified.   -- given 1000cc NSS and 25mg  lopressor.     HISTORY- retinal detachment

## 2013-03-10 NOTE — ED Notes (Signed)
Pt HR consistently - NSR 80-90's.    No palpitations.   No chest pain   GCS 15.

## 2013-03-10 NOTE — ED Triage Note (Signed)
Pt ambulated into ED  Pt GCS 15    Pt +c/o "burn in chest with not normal heart beat." per patient    Pt GCS 15. Ambulates with steady gait. Speech clear and appropriate.   No SOB. No chest pain. No palpitations. No nausea/vomit/dairrhea.   No urine complaints.     HISTORY- retinal detachment  MEDICATIONS- denies daily medications

## 2013-03-10 NOTE — ED Notes (Signed)
EKG done at this time

## 2013-03-10 NOTE — ED Notes (Signed)
Pt with noted HR (ST) into 130's - 150's.   +palpitations per patient  No chest pain    MD Wagner at bedside  EKG done at this time as ordered.

## 2013-03-11 ENCOUNTER — Encounter (HOSPITAL_BASED_OUTPATIENT_CLINIC_OR_DEPARTMENT_OTHER): Payer: Self-pay | Admitting: Internal Medicine

## 2013-03-11 LAB — TROPONIN I
TROPONIN I: <0.02 ng/mL (ref 0.00–0.04)
TROPONIN I: <0.02 ng/mL (ref 0.00–0.04)

## 2013-03-11 LAB — LIPID PANEL
Cholesterol: 174 mg/dL (ref 0–239)
HIGH DENSITY LIPOPROTEIN: 47 mg/dL (ref 40–60)
LOW DENSITY LIPOPROTEIN DIRECT: 118 mg/dL (ref 0–189)
TRIGLYCERIDES: 65 mg/dL (ref 0–150)

## 2013-03-11 LAB — THYROID SCREEN TSH REFLEX FT4: THYROID SCREEN TSH REFLEX FT4: 2.85 u[IU]/mL (ref 0.358–3.740)

## 2013-03-11 LAB — MAGNESIUM: MAGNESIUM: 2.2 mg/dL (ref 1.8–2.4)

## 2013-03-11 LAB — PHOSPHORUS: PHOSPHORUS: 4 mg/dL (ref 2.5–4.9)

## 2013-03-11 LAB — XR CHEST PORTABLE

## 2013-03-11 LAB — HOLD PURPLE TOP TUBE

## 2013-03-11 MED ORDER — METOPROLOL TARTRATE 25 MG PO TABS
25.0000 mg | ORAL_TABLET | Freq: Two times a day (BID) | ORAL | Status: DC
Start: 2013-03-11 — End: 2013-03-11

## 2013-03-11 MED ORDER — PNEUMOCOCCAL VAC POLYVALENT 25 MCG/0.5ML IJ INJ
0.50 mL | INJECTION | Freq: Once | INTRAMUSCULAR | Status: AC
Start: 2013-03-11 — End: 2013-03-11
  Administered 2013-03-11: 0.5 mL via INTRAMUSCULAR
  Filled 2013-03-11: qty 0.5

## 2013-03-11 MED ORDER — HEPARIN SODIUM (PORCINE) 5000 UNIT/ML IJ SOLN
5000.0000 [IU] | Freq: Two times a day (BID) | INTRAMUSCULAR | Status: DC
Start: 2013-03-11 — End: 2013-03-11
  Administered 2013-03-11 (×2): 5000 [IU] via SUBCUTANEOUS
  Filled 2013-03-11 (×2): qty 1

## 2013-03-11 MED ORDER — ACETAMINOPHEN 650 MG PO TABS
650.00 mg | ORAL_TABLET | Freq: Four times a day (QID) | ORAL | Status: AC | PRN
Start: 2013-03-11 — End: 2013-03-21

## 2013-03-11 MED ORDER — ACETAMINOPHEN 325 MG PO TABS
650.0000 mg | ORAL_TABLET | Freq: Four times a day (QID) | ORAL | Status: DC | PRN
Start: 2013-03-11 — End: 2013-03-11

## 2013-03-11 MED ORDER — ASPIRIN EC 325 MG PO TBEC
325.0000 mg | DELAYED_RELEASE_TABLET | Freq: Every day | ORAL | Status: DC
Start: 2013-03-11 — End: 2013-03-11
  Administered 2013-03-11: 325 mg via ORAL
  Filled 2013-03-11: qty 1

## 2013-03-11 NOTE — ED Notes (Signed)
[facility] bed WEST 115 assigned at this time [phone])   --attempt to call report at this time

## 2013-03-11 NOTE — ED Notes (Signed)
Pt in [place] for 5-10 beats then back to NSR  - md wagner aware

## 2013-03-11 NOTE — Discharge Summary (Signed)
Physician Discharge Summary     Patient ID:  Sean Chase  [id number]  32 year old  1981-02-13    Admit date: 03/10/2013    Discharge date: 03/11/2013    Admitting Provider: Carma Leaven, PA-C    Discharge Physician: Diona Browner, MD   [facility], [address] [place] [address]. [phone]   Pager [phone]   E-mail: [email or web address]   24/7 Hospitalist Coverage: Pager [phone]  [facility] Medical Records Department [phone] (Main Switchboard)    Admission Diagnoses: Palpitations    Discharge Diagnoses:   Palpitations    Admission Condition: Good    Discharged Condition: Good    Indication for Admission: Palpitations, rule-out arrhythmia  32 year old male with past medical history of toxoplasmosis and ruptured left globe who presented to the [facility] Emergency Department with a chief complaint of a chest burning sensation and intermittent episodes of palpitations beginning around 2 p.m. after eating lunch, drinking a coffee, then a bottle of Gatorade, then vomiting once. Palpations were intermittent and lasted for about 1 hour with spontaneous resolution. There was no associated chest pain or shortness of breath. No recent travel, ill contacts, edema or orthopnea. No recent drug use. 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     Hospital Course:     He noted a sensation of palpitations approximately 1 AM on 6/11, but review of his telemetry at that time, and for the entire stay, revealed no arrhythmias. Troponins were negative. Urine drug screen was negative.   It is felt that use of caffeine, over the counter cold medicine (likely with a decongestant), and perhaps emerging "cold" could have played a role in his symptoms. He does however report having had a brief episode of palpitations approximately 1 month ago. Also of note,  he did have a brother who apparently died in his 35's of a "heart attack." Echocardiography should be considered. If symptoms should recur, Holter or event monitor might prove enlightening. These additional studies will be deferred to the outpatient setting. TSH was requested and pending at the time of discharge.     Pending Test Results:   TSH, with reflex T4    Outpatient Follow-up Issues:  1. Any further palpitations  2. Consider echocardiography  3. If symptoms recur, consider Holter or event monitor  4. Follow-up TSH result    Consults: None    Discharge Exam:  BP 105/73  Pulse 62  Temp(Src) 98.3 F (36.8 C) (Temporal)  Resp 16  Ht 6' (1.829 m)  Wt 179 lb (81.194 kg)  BMI 24.27 kg/m2  SpO2 98%  Alert, looking well. No oropharyngeal lesions or exudates. No neck lymphadenopathy. No thyroid abnormalities. CTA. Reg rhythm with no M/G/R. NA BS, ND, NT. No tremor.     Code Status during hospital stay: Full    Disposition: Home    Patient Instructions:   During my hospitalization, I was treated for the following conditions:   Heart Palpitations, possibly due to use of caffeine and a "cold" medicine that may contain a stimulating decongestant medicine.     Description of Hospital Course:   Tests showed no evidence of a heart attack.   No abnormal heart rhythms were detected.     The goals my hospital doctors and treatment team have for my care are:  Use caffeine in moderation.   Avoid medicines that contain a "decongestant" ingredient (such as pseudoephedrine or phenylephrine).   Ensure adequate sleep.   Follow-up with your outpatient team.     Goals I have for getting healthier are:  Be sure the heart is OK.     Please call your Primary Care Provider's (PCP) office if you have:   Return of palpitations, chest pain, shortness of breath, dizziness, worsening neck/throat pain, or for any other concerns, or questions, or if you are having trouble getting medicines or appointments you need.    FOR EMERGENCIES CALL  [phone]    Please discuss the following issues with your Primary Care Provider (PCP):  1. Any further palpitations or new symptoms  2. Possible heart ultrasound test (echocardiogram)  3. Possible further heart monitoring test if the symptoms return    Diet: heart-healthy diet (low fat, low cholesterol, low salt/sodium, low sugar). Try to reduce caffeine intake.     Activity: gradual return to usual activity level as tolerated.     Inpatient provider:  Diona Browner, MD   Contact information:  [facility], [address] [place] [address] . [phone]  ---------------------------------------------------------------------------------------------------------    Medication Changes:   Acetaminophen prescribed as needed for pain     Current Discharge Medication List    START taking these medications    acetaminophen 650 MG TABS  Take 650 mg by mouth every 6 (six) hours as needed for Pain or Fever.  Qty: 1 tablet Refills: 0        [place] Future Appointments:  Current and Future Appointments at [facility] (90 Days)                    03/18/2013  2:50 PM OFF 40 [facility] [[id number]] Juliane Liberus 40 min           Follow-up:  Follow-up Information    Follow up With Details Comments Contact Info    Juliane Liberus On 03/18/2013 @ 2:50 PM  [address]  [place] [address]  [phone]            Signed:  Diona Browner, MD  03/11/2013  10:18 AM

## 2013-03-11 NOTE — ED Notes (Signed)
Pt left ED on [facility] - GCS 15.

## 2013-03-11 NOTE — H&P (Signed)
Date of Admission: 03/10/2013    CHIEF COMPLAINT:  Palpitations.    HISTORY OF PRESENT ILLNESS:  This is a 32 year old male with past medical history of toxoplasmosis and ruptured left globe who presents to the Emergency Department with a chief complaint of a burning sensation and intermittent episodes of palpitations beginning around 2 p.m. after eating lunch.  Palpations have been intermittent and lasts for about 1 hour and resolve on their own spontaneously.  There is no associated chest pain or shortness of breath.  No recent travel, ill contacts, edema or orthopnea.  No recent drug use.  He states that he drinks 1 small [employer] coffee daily but states that he also began taking an over-the-counter "flu medicine" at [facility] yesterday and today.  This is the only new medication that he has started.  At this time, he is entirely asymptomatic and states that the metoprolol they give him at [facility] greatly relieved his symptoms.  He did have 1 episode of vomiting earlier today, which was nonbloody and nonbilious.  The patient denies any history of thyroid disorders.    REVIEW OF SYSTEMS:  All other systems reviewed and negative, except as noted above.    PAST MEDICAL HISTORY:  1.  Retinal detachment.  2.  Ocular hypertension.  3.  Toxoplasmosis of left eye.  4.  Ruptured left globe.    SOCIAL HISTORY:  The patient is from [place], but he speaks English quite well.  He is an ex-smoker and quit approximately 1 month ago.  Rarely drinks alcohol, none recently and no illicit drug use.    PAST SURGICAL HISTORY:  1.  Ruptured globe repair.  2.  Retinal detachment surgery.    FAMILY HISTORY:  Father unknown heart disease.  Had a catheterization at some point in his 5s.  Also had a brother who died of unknown cause at age 27 or 8.    ALLERGIES:  No known drug allergies.    MEDICATIONS:  The patient does not take any medications on a daily basis.    PHYSICAL EXAMINATION:  VITAL SIGNS:  Temperature 98 degrees  Fahrenheit orally, pulse 58 beats per minute, respirations 16 breaths per minute, blood pressure 121/79 mmHg, oxygen saturation 97% on room air.  GENERAL:  This is a pleasant 32 year old well-appearing male sitting comfortably in bed.  He is alert and oriented to person, place, time, situation, and is in no acute distress at this time.  SKIN:  Warm, dry.  No rashes, masses, or lesions noted.  HEENT:  Normocephalic, atraumatic, clouded left cornea.  Extraocular movements are intact.  Mucous membranes are moist.  NECK:  Supple, no JVD, no lymphadenopathy, full range of motion.  Neck is nontender.  There is no thyromegaly.  CARDIOVASCULAR:  Bradycardic, normal S1, S2, no murmurs, rubs, or gallops heard.  There are no carotid bruits.  LUNGS:  Clear to auscultation bilaterally.  ABDOMEN:  Normoactive bowel sounds throughout.  Soft, nontender, no organomegaly.  MUSCULOSKELETAL:  The patient moves the bilateral upper and lower extremities equally.  No chest wall tenderness with palpation.  LYMPHATICS:  There is no pedal edema.  NEUROLOGIC:  Cranial nerves 2-12 are grossly intact.    LABORATORY:  Complete blood count and basic metabolic profile are entirely within normal limits.  Troponin is normal at less than 0.02.  Urinalysis shows no evidence of urinary tract infection.  Urine drug screen is entirely negative.    IMAGING STUDIES:  1.  Portable chest x-ray by my  read shows no evidence of any acute cardiopulmonary process.  2.  EKG by my read shows normal sinus rhythm at 88 beats per minute, no acute ST-T changes or signs of ischemia or infarct by my read.    ASSESSMENT AND PLAN:  In brief, this is a 32 year old male who presents to the Emergency Department with a chief complaint of chest burning and palpitations beginning 1 hour after eating lunch earlier today.  In the Emergency Room, he received 1 L of normal saline and 25 mg of metoprolol with good relief of his symptoms.  He will be admitted for further  observation.    1.  Tachycardia.  Patient's tachycardia has entirely resolved with the metoprolol and fluids he received at the Emergency Room.  I suspect that his symptoms are likely related to the combination of the over-the-counter "flu" medication and his caffeine intake from earlier today.  He will be monitored on telemetry tonight and rule out for ACS by obtaining serial EKGs and troponins.  We will check a lipid panel and his electrolytes in the morning as well as send for a thyroid stimulating hormone.  I feel the patient can safely follow up with cardiology as an outpatient and do not feel he needs urgent consultation at this time.  He will also be started on metoprolol 25 mg every 12 hours should his symptoms persist.    2.  Prophylaxis.  Deep vein thrombosis prophylaxis with heparin 5000 units subcutaneously twice daily.    3.  Primary care physician is Dr. Steward Ros.    CODE STATUS:  Full.    ___________________________  Reviewed and Electronically Signed By: Carma Leaven PA-C  Sig Date: 03/14/2013  Sig Time: 17:08:17  Dictated By: Carma Leaven PA-C  Dict Date: 03/11/2013 Dict Time: 02 04 AM    Dictation Date and Time:03/11/2013 02:04:07  Transcription Date and Time:03/11/2013 05:44:22  eScription Dictation id: [id number] Confirmation # :[id number]

## 2013-03-11 NOTE — Progress Notes (Signed)
Admitted from ED pt denied any pain/discomfort sleeping at present will cont to monitor.

## 2013-03-11 NOTE — Progress Notes (Signed)
Pt discharged home . All discharged instructions given to patient . Interpreter service used for discharged instructions.

## 2013-03-11 NOTE — ED Notes (Signed)
Report given to RN Claris Che at [facility]    [facility] ambulance called at this time.

## 2013-03-11 NOTE — ED Notes (Signed)
Report given to [facility] ambulance.   --pt GCs 15.

## 2013-03-11 NOTE — Discharge Instructions (Signed)
During my hospitalization, I was treated for the following conditions:   Heart Palpitations, possibly due to use of caffeine and a "cold" medicine that may contain a stimulating decongestant medicine.     Description of Hospital Course:   Tests showed no evidence of a heart attack.   No abnormal heart rhythms were detected.     The goals my hospital doctors and treatment team have for my care are:   Use caffeine in moderation.   Avoid medicines that contain a "decongestant" ingredient (such as pseudoephedrine or phenylephrine).   Ensure adequate sleep.   Follow-up with your outpatient team.     Goals I have for getting healthier are:  Be sure the heart is OK.     Please call your Primary Care Provider's (PCP) office if you have:   Return of palpitations, chest pain, shortness of breath, dizziness, worsening neck/throat pain, or for any other concerns, or questions, or if you are having trouble getting medicines or appointments you need.    FOR EMERGENCIES CALL 911    Please discuss the following issues with your Primary Care Provider (PCP):  1. Any further palpitations or new symptoms  2. Possible heart ultrasound test (echocardiogram)  3. Possible further heart monitoring test if the symptoms return    Diet: heart-healthy diet (low fat, low cholesterol, low salt/sodium, low sugar). Try to reduce caffeine intake.     Activity: gradual return to usual activity level as tolerated.     Inpatient provider:  Diona Browner, MD   Contact information:  [facility], [address] [place] [address] . [phone]

## 2013-03-11 NOTE — Progress Notes (Signed)
Pt complaining of ? palpitation tele monitor Hr 60-80. Dr Berneda Rose notified. Md in to see patient. No further complain . Continue to monitor.

## 2013-03-13 LAB — EKG

## 2013-03-18 ENCOUNTER — Ambulatory Visit (HOSPITAL_BASED_OUTPATIENT_CLINIC_OR_DEPARTMENT_OTHER): Payer: PRIVATE HEALTH INSURANCE | Admitting: Family Medicine

## 2013-03-20 LAB — EKG

## 2013-03-26 ENCOUNTER — Ambulatory Visit (HOSPITAL_BASED_OUTPATIENT_CLINIC_OR_DEPARTMENT_OTHER): Payer: PRIVATE HEALTH INSURANCE | Admitting: Family Medicine

## 2013-08-25 ENCOUNTER — Encounter (HOSPITAL_BASED_OUTPATIENT_CLINIC_OR_DEPARTMENT_OTHER): Payer: Self-pay | Admitting: Physician Assistant

## 2013-08-25 ENCOUNTER — Ambulatory Visit (HOSPITAL_BASED_OUTPATIENT_CLINIC_OR_DEPARTMENT_OTHER): Payer: Self-pay | Admitting: Physician Assistant

## 2013-08-25 VITALS — BP 102/76 | HR 79 | Temp 97.6°F | Resp 16 | Ht 72.0 in | Wt 167.0 lb

## 2013-08-25 DIAGNOSIS — J310 Chronic rhinitis: Secondary | ICD-10-CM

## 2013-08-25 DIAGNOSIS — T485X5A Adverse effect of other anti-common-cold drugs, initial encounter: Principal | ICD-10-CM

## 2013-08-25 MED ORDER — FLUTICASONE PROPIONATE 50 MCG/ACT NA SUSP
1.00 | Freq: Every day | NASAL | Status: AC
Start: 2013-08-25 — End: 2013-09-24

## 2013-08-25 NOTE — Patient Instructions (Signed)
Oxymetazoline - avoid this medication as likely caused worsening symptoms.

## 2013-08-25 NOTE — Progress Notes (Signed)
Sean Chase is a 32 year old male who presents with 2 years or chronic sinus congestion and pressure. Is having an acute worsening. Feels like pressure in the face. Present for several months. Breathing through mouth. Has used nasal sprays for several months. States tried allergy medications with no improvement. No f/c.     Sinus Problem  Associated symptoms include congestion. Pertinent negatives include no chills, fever, headaches, myalgias or rash.     Review of Systems   Constitutional: Negative.  Negative for fever and chills.   HENT: Positive for congestion. Negative for hearing loss.    Respiratory: Negative.    Cardiovascular: Negative.    Gastrointestinal: Negative.    Musculoskeletal: Negative.  Negative for myalgias.   Skin: Negative.  Negative for rash.   Neurological: Negative.  Negative for headaches.   Psychiatric/Behavioral: Negative.      Physical Exam   Nursing note and vitals reviewed.  Constitutional: He is oriented to person, place, and time. He appears well-developed and well-nourished. No distress.   HENT:   Head: Normocephalic and atraumatic.   Right Ear: Hearing, tympanic membrane, external ear and ear canal normal. No drainage. No decreased hearing is noted.   Left Ear: Tympanic membrane, external ear and ear canal normal. No drainage. No decreased hearing is noted.   Nose: Mucosal edema and rhinorrhea present. No nasal deformity, septal deviation or nasal septal hematoma. Right sinus exhibits maxillary sinus tenderness. Left sinus exhibits maxillary sinus tenderness.   Mouth/Throat: No oropharyngeal exudate.   Moderate edema of turbinates   Eyes: Conjunctivae are normal.   Pulmonary/Chest: Effort normal.   Musculoskeletal: Normal range of motion.   Neurological: He is alert and oriented to person, place, and time.   Skin: Skin is warm and dry. He is not diaphoretic.   Psychiatric: He has a normal mood and affect. His behavior is normal. Judgment and thought content normal.   BP  102/76  Pulse 79  Temp(Src) 97.6 F (36.4 C) (Temporal)  Resp 16  Ht 6' (1.829 m)  Wt 167 lb (75.751 kg)  BMI 22.64 kg/m2  SpO2 99%    (472.0) Rhinitis medicamentosa  (primary encounter diagnosis)  Comment:   Plan: Must stop all oxymetazoline products. Flonase. Discussed will have worsening congestion in the meantime and should improve.     I have reviewed the past medical, surgical, social and family history and updated these sections of EpicCare as relevant. All interim labs, test results, and consult notes were reviewed and discussed with Noralee Stain. Medications were reconciled during this visit and a current medication list was given to the patient at the end of the visit.

## 2013-10-22 ENCOUNTER — Encounter (HOSPITAL_BASED_OUTPATIENT_CLINIC_OR_DEPARTMENT_OTHER): Payer: Self-pay | Admitting: Internal Medicine

## 2013-10-22 ENCOUNTER — Ambulatory Visit (HOSPITAL_BASED_OUTPATIENT_CLINIC_OR_DEPARTMENT_OTHER): Payer: PRIVATE HEALTH INSURANCE | Admitting: Internal Medicine

## 2013-10-22 VITALS — BP 110/70 | HR 72 | Temp 97.7°F | Ht 72.0 in | Wt 162.0 lb

## 2013-10-22 DIAGNOSIS — S6992XS Unspecified injury of left wrist, hand and finger(s), sequela: Secondary | ICD-10-CM

## 2013-10-22 DIAGNOSIS — H40053 Ocular hypertension, bilateral: Secondary | ICD-10-CM

## 2013-10-22 DIAGNOSIS — Z0000 Encounter for general adult medical examination without abnormal findings: Secondary | ICD-10-CM

## 2013-10-22 DIAGNOSIS — IMO0002 Reserved for concepts with insufficient information to code with codable children: Secondary | ICD-10-CM

## 2013-10-22 DIAGNOSIS — Z23 Encounter for immunization: Secondary | ICD-10-CM

## 2013-10-22 LAB — CHOLESTEROL: Cholesterol: 223 mg/dL (ref 0–239)

## 2013-10-22 LAB — HIGH DENSITY LIPOPROTEIN: HIGH DENSITY LIPOPROTEIN: 62 mg/dL (ref 40–?)

## 2013-10-22 LAB — LOW DENSITY LIPOPROTEIN DIRECT: LOW DENSITY LIPOPROTEIN DIRECT: 149 mg/dL (ref 0–189)

## 2013-10-22 NOTE — Progress Notes (Signed)
[age]y M presents to establish care, mult ocular issues [facility].    Already got flu shot.    ROS  Constitutional: Denies fever, weight loss, sweats  Cardiovascular: Denies chest pain, palpitations  Respiratory: Denies SOB, cough  GI: Denies n/v/d  GU: Denies dysuria, hematuria  Neuro: Denies numbness/tingling, weakness, sensory changes  Derm: Denies rashes  Lymph: Denies edema, adenopathy  MSK: Denies weakness. Old injury on L index finger. occ feels it.      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    RETINAL DETACHMENT SURGERY  7/11    Comment s/p retinal detachment surgery, in setting of ocular toxoplasmosis OS, [facility]    FINGER SURGERY  ~2004    Comment L hand index finger, injury       Family History    Hypertension Mother     Hypertension Father     Heart Brother     Comment: unknown heart problem, but unsure of cause    Hypertension Brother        Social History   Marital Status: Single  Spouse Name: N/A    Years of Education: N/A  Number of Children: N/A     Occupational History  None on file     Social History Main Topics   Smoking status: Former Smoker     Quit date: 12/30/2012    Smokeless tobacco: Never Used    Alcohol Use: No    Comment: social    Drug Use: No    Sexual Activity: Yes    Partners: Female    Comment: Wife, 2010- she has IUD     Other Topics Concern   None on file     Social History Narrative    10/2013    Lives with wife, 2 babies. No pets.    Work: Conservator, museum/galleryelf-employed, Event organisercarpentry, Publishing copydemolition, Pension scheme managerpainting    School: Up to age 33, high school.    From [place], 05[place]2000 came to [place]    + safe at home, good supports.    + uses seat belt        Exercise: Plays  sports, soccer           No current outpatient prescriptions on file prior to visit.  No current facility-administered medications on file prior to visit.    Review of Patient's Allergies indicates:  No Known Allergies    BP 110/70   Pulse 72   Temp(Src) 97.7 F (36.5 C) (Temporal)   Ht 6' (1.829 m)   Wt 162 lb (73.483 kg)   BMI 21.97 kg/m2   SpO2 99%  Pain Score: 0 (0/10)    Gen: NAD  HEENT: MMM, OP clear, PERRL, no thyromegaly  Cor: RRR no m/r/g  Chest: CTA b/l  Abd: S/NT/ND +BS  Ext: No c/c/e. Bony deformity at index finger, well healed.   Neuro: Grossly intact  Skin: No rashes  LAD: No cervical lymphadenopathy        32y M presents to establish care, mult ocular [facility]    (V70.0) Routine adult health maintenance  (  primary encounter diagnosis)  Comment: HIV neg before marriage.  Plan: REFERRAL TO ORTHOPEDICS ( INT), CHOLESTEROL,         HIGH DENSITY LIPOPROTEIN, LOW DENSITY         LIPOPROTEIN,DIRECT    (908.9) Hand injury, left, sequela  Comment: Will see if revision an option  Plan: REFERRAL TO ORTHOPEDICS ( INT)    (V06.5) Need for prophylactic vaccination with tetanus-diphtheria (TD)  Plan: IMMUNIZATION ADMIN SINGLE, RN, TETANUS &         DIPHTHERIA TOXOIDS ADSORBED 7/>YR IM    Ocular hypertension  - Will cont tx at [facility].        10/22/2013  VIS given prior to administration and reviewed with the patient and or legal guardian. Patient understands the disease and the vaccine. See immunization/Injection module or chart review for date of publication and additional information.  Rebekah Zackery MD

## 2013-10-22 NOTE — Progress Notes (Signed)
Pt feels safe at home

## 2013-10-22 NOTE — Progress Notes (Signed)
VIS given prior to administration and reviewed with the patient and or legal guardian. Patient understands the disease and the vaccine. See immunization/Injection module or chart review for date of publication and additional information.    Lynde Ludwig, LPN

## 2013-11-11 ENCOUNTER — Telehealth (HOSPITAL_BASED_OUTPATIENT_CLINIC_OR_DEPARTMENT_OTHER): Payer: Self-pay | Admitting: Registered Nurse

## 2013-11-11 NOTE — Progress Notes (Addendum)
Call to pt.  Reviewed results.  LDL a bit elevated. Encouraged diet and increase exercise and will f/u.  Pt verbalized understanding and agrees with plan.

## 2013-11-11 NOTE — Telephone Encounter (Signed)
Message copied by Olevia BowensFARIA Wetona Viramontes on Wed Nov 11, 2013  1:56 PM  ------       Message from: Larena GlassmanOELHO, JUNIA       Created: Wed Nov 11, 2013 12:15 PM       Regarding: Results       Contact: [phone]                       Sean Chase [id number], 33 year old, male, Telephone Information:       Home Phone      [phone]       Work Phone      Not on file.       Mobile          [phone]                     Patient's Preferred Pharmacy:               [facility]       Phone: [phone] Fax: [phone]                     CONFIRMED TODAY: Lovett SoxYes              CALL BACK NUMBER: [phone]       Best time to call back:        Cell phone:        Other phone:              Available times:              Patient's language of care: [place]              Patient needs a [place] interpreter.              Patient's PCP: Shah,Sural MD              Person calling on behalf of patient: Patient (self)              Calls today        Pt calling w/ questions about his results received from Dr.Shah.                       ------

## 2013-11-16 ENCOUNTER — Other Ambulatory Visit (HOSPITAL_BASED_OUTPATIENT_CLINIC_OR_DEPARTMENT_OTHER): Payer: Self-pay | Admitting: Licensed Practical Nurse

## 2013-11-16 ENCOUNTER — Ambulatory Visit (HOSPITAL_BASED_OUTPATIENT_CLINIC_OR_DEPARTMENT_OTHER): Payer: PRIVATE HEALTH INSURANCE | Admitting: Hand Surgery

## 2013-11-16 DIAGNOSIS — M20009 Unspecified deformity of unspecified finger(s): Secondary | ICD-10-CM

## 2013-11-16 DIAGNOSIS — M79642 Pain in left hand: Secondary | ICD-10-CM

## 2013-11-16 LAB — XR HAND LEFT MINIMUM 3 VIEWS

## 2013-11-16 NOTE — Progress Notes (Signed)
DX:   1. Old left index and middle finger injury, DOI 2005  2. Left hand deformity    HPI:  New patient 33 year old male who presents with left index and middle finger deformity from an injury in 2005. He states he originally injured his left index and middle finger in 2005 while using a table saw. He had surgery in 2005 to repair his left index finger. He states he was seen in [facility]. He complains of moderate pain 6/10 located to his left index and middle finger when it is cold outside. He denies any pain today on presentation today. He is right handed dominant and works in [employer]. He denies any numbness or tingling located to his left index and middle fingers. He is unable to make a full fist with his left hand. 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    RETINAL DETACHMENT SURGERY  7/11    Comment s/p retinal detachment surgery, in setting of ocular toxoplasmosis OS, done at [facility]ERY  ~2004    Comment L hand index finger, injury         Primary Care Physician: Shah,Sural MD    Physical Exam:  General: Alert and cooperative, no acute distress  Extremities:  There is a noticeable deformity of the left index finger. His left hand skin is warm and dry. There is atrophy noted to the left index finger compared to the right index finger. There is a hold healed scar to the left index finger  PIP joint on the dorsum. There is a healed scar to the left middle finger PIP joint on the dorsal side. He can fully extend his left middle finger at the MCP joint. He can not extend his left index finger at the PIP joint fully. He lacks 10 degrees of extension at his left index finger PIP joint. His left index finger beyond the PIP joint is radially deviated 26 degrees. He is unable to flex at the PIP joint of his left index finger.He can flex at the left index finger MCP joint 80 degrees. He can flex at the left middle finger PIP 40 degrees. He can flex at the left middle finger and index finger DIP joint 85 degrees. He can make a functional fist with his left hand and touch his finger tips to the palm of the hand. He has good capillary refill throughout his left fingers.  Pulses: +2 symmetrical throughout  Neuro: No neurosensory loss, AORx 3, median/ulnar/radial intact. He has good sensation on the radial and ulnar sides of his left index and middle fingers.  Skin: without rash, ulcer, lesion, abrasion    X-Ray : There is complete fusion of the index finger PIP joint. On the AP view the  left index finger at the PIP joint is radial deviated. The left middle finger PIP has arthritic changes with some partial fusing of the joint.    All pertinent imaging was also reviewed by Dr. Olam IdlerMulley today.      DX:   1. Old left index and middle finger injury, DOI 2005  2. Left hand deformity      A/P: Sean Chase is a 33 year old male who presented with left index finger deformity from an old injury to the index and middle finger. He originally injured his left index and middle fingers in 2005 during a table saw accident. On examination today his left index finger was medially deviated compared to his other fingers. X-ray of his left index finger showed a completely fused joint of the left index finger PIP. His left middle finger PIP was partially fused. On examination he is able to functionally make a full fist with his  left hand. He stated he has not had any problems using his left hand while at work and during his daily routine. Dr. Olam IdlerMulley did explain to him we could surgically realign his left index finger PIP joint, but he would be unable to flex at this joint still after the surgery.The patient stated he did not want to have surgery and he was fine with the appearance of his left index finger. He was provided an Orthopedic card and told to come back to the clinic if he would ever want to surgically realign his left index finger. Please see Dr. Georges LynchMulley's dictated note from today's examination.      Patient was discussed and examined by Dr. Olam IdlerMulley and she agrees with current plan and medical treatment.      Sean Carothers C. Artis FlockWolfe, PA-C, 11/16/2013, 9:32 AM

## 2013-11-16 NOTE — Progress Notes (Signed)
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    He has a fusion of the PIP joint of the left index finger with radial deviation at the distal aspect of the finger and a partial fusion of the middle finger PIP joint.  Please see the full note of Syliva OvermanLogan Wolfe, [place].  There is really nothing that can be done to make him more functional with his hand.  He is doing quite well as it is.  Cosmetically, I could straighten the index finger and refuse it, but he does not think that it is really worth the investment of his time, and I agree with him.  I would be happy to see him back at any point.  I, Dr. Michela Pitcherebra Shyonna Carlin, examined this patient and formulated the plan of care.    ___________________________  Reviewed and Electronically Signed By: Resa MinerEBRA A Shahida Schnackenberg MD  Sig Date: 07/23/2014  Sig Time: 23:05:56  Dictated By: Resa MinerEBRA A Zelena Bushong MD  Dict Date: 11/16/2013 Dict Time: 10 51 AM    Dictation Date and Time:11/16/2013 10:51:09  Transcription Date and Time:11/16/2013 10:27:35  eScription Dictation id: [id number] Confirmation # :[id number]

## 2023-03-23 IMAGING — MR [HOSPITAL]^LOMBAR
6 series · 38 of 48 positions shown · non-contrast
Comparison: none

[Series 1: loc · sagittal · 10.0mm · 1.88mm/px · 2 of 8 slices shown]
[im 1/8]
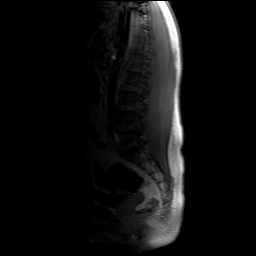
[im 3/8]
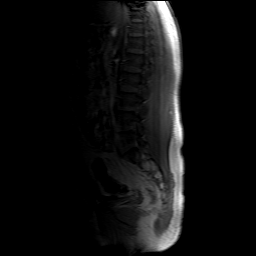

[Series 2: T2 · sagittal · 4.0mm · 0.88mm/px · 6 of 14 slices shown (1 of 3)]
[im 1/14]
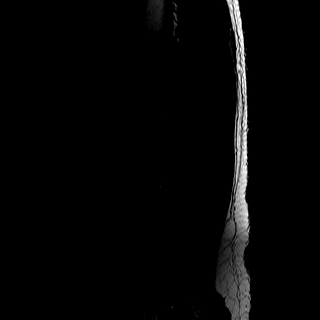
[im 3/14]
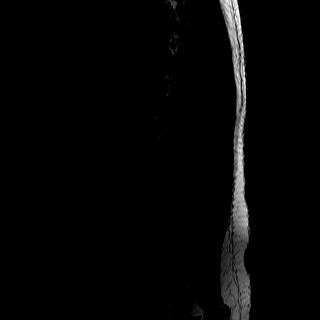
[im 6/14]
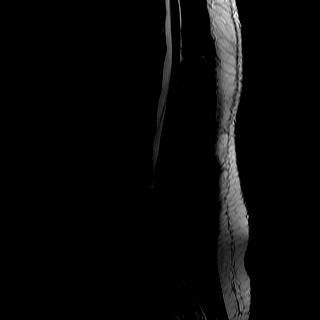
[im 8/14]
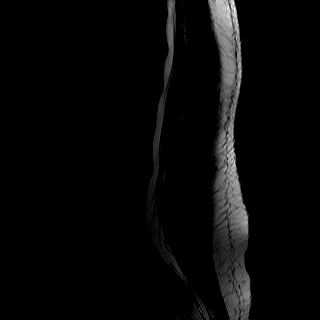
[im 11/14]
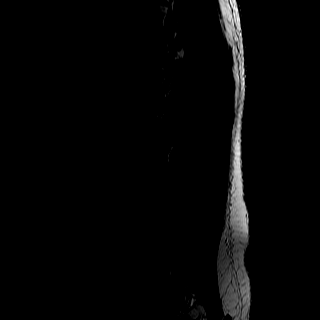
[im 14/14]
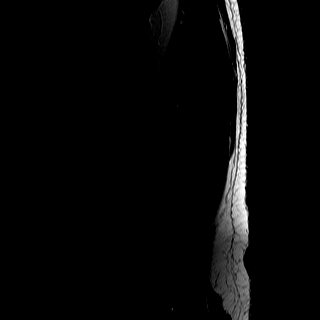

[Series 3: T2 fat-sat · sagittal · 4.0mm · 0.88mm/px · 6 of 14 slices shown]
[im 1/14]
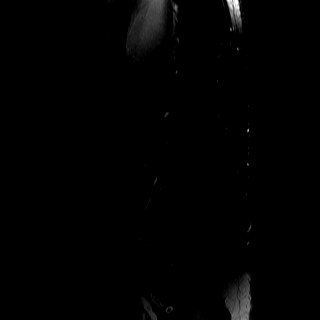
[im 3/14]
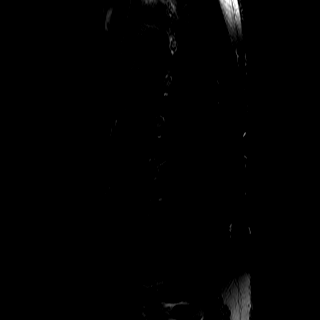
[im 6/14]
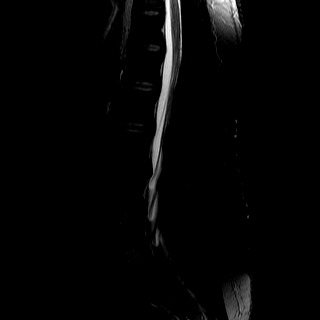
[im 8/14]
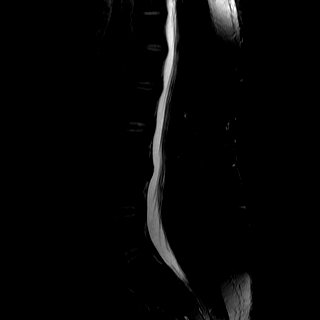
[im 11/14]
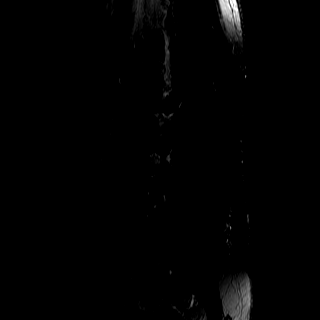
[im 14/14]
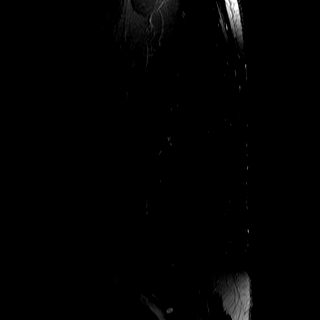

[Series 4: T1 · sagittal · 4.0mm · 0.88mm/px · 6 of 14 slices shown]
[im 1/14]
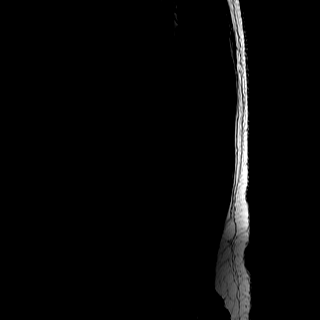
[im 3/14]
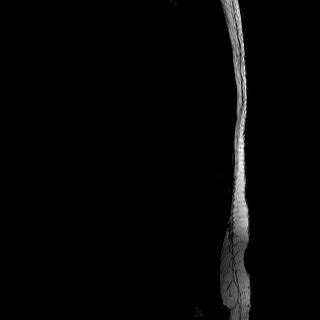
[im 6/14]
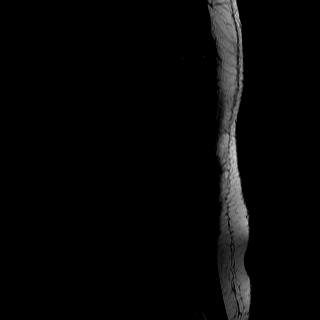
[im 8/14]
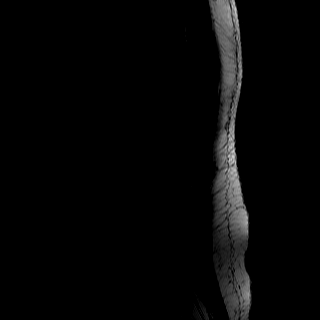
[im 11/14]
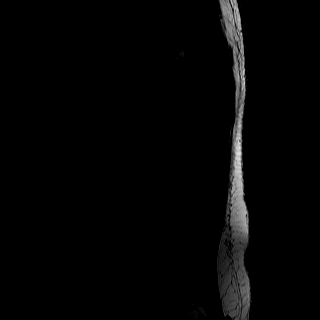
[im 14/14]
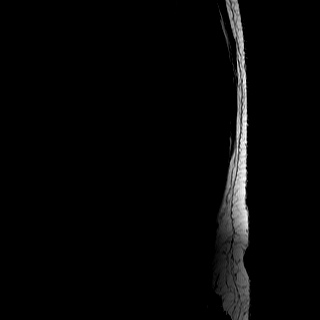

[Series 5: T2 · axial · 4.0mm · 0.86mm/px · z∈[-69,+161]mm · 10 of 38 slices shown (2 of 3)]
[im 3/38]
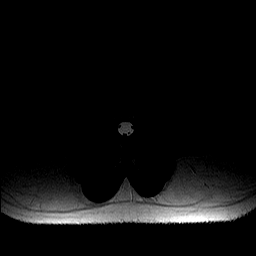
[im 5/38]
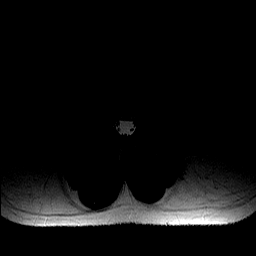
[im 8/38]
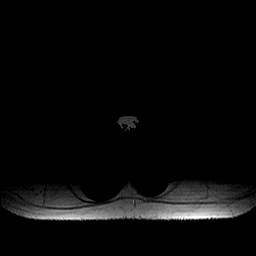
[im 13/38]
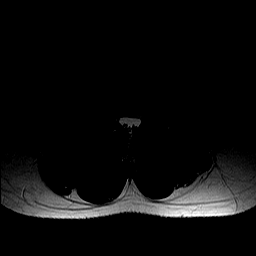
[im 18/38]
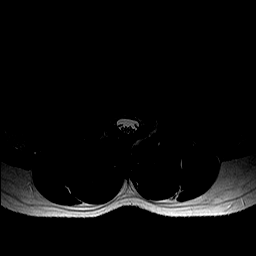
[im 20/38]
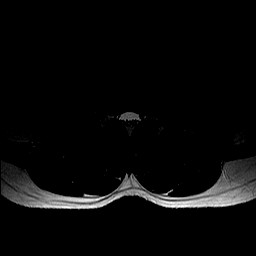
[im 23/38]
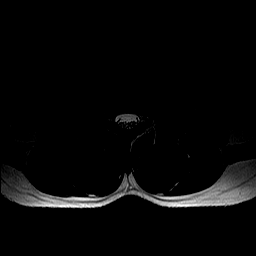
[im 28/38]
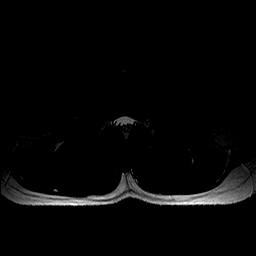
[im 33/38]
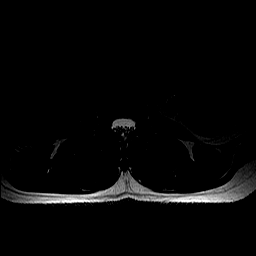
[im 38/38]
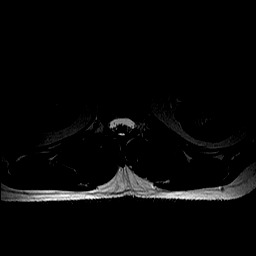

[Series 6: T2 · coronal · 5.0mm · 0.51mm/px · 8 of 24 slices shown (3 of 3)]
[im 1/24]
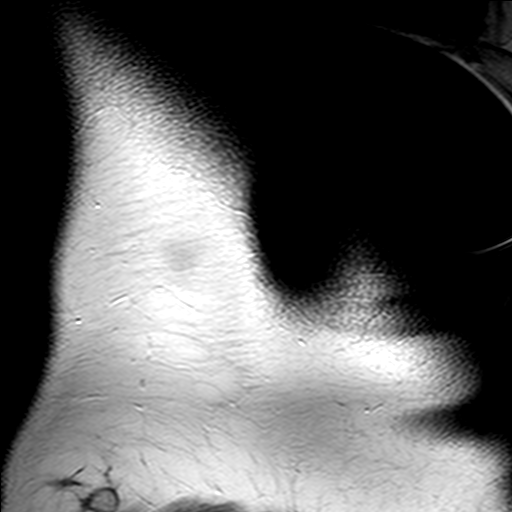
[im 3/24]
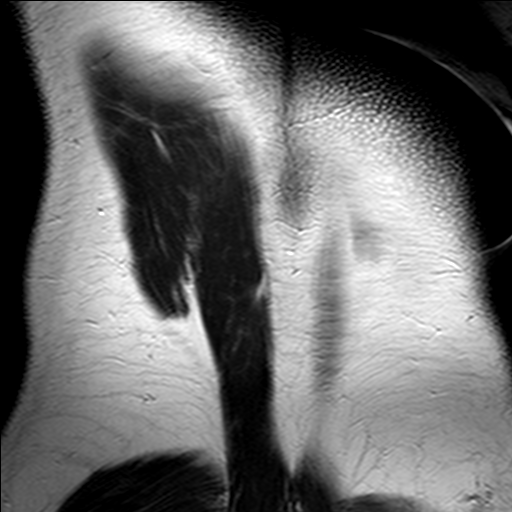
[im 8/24]
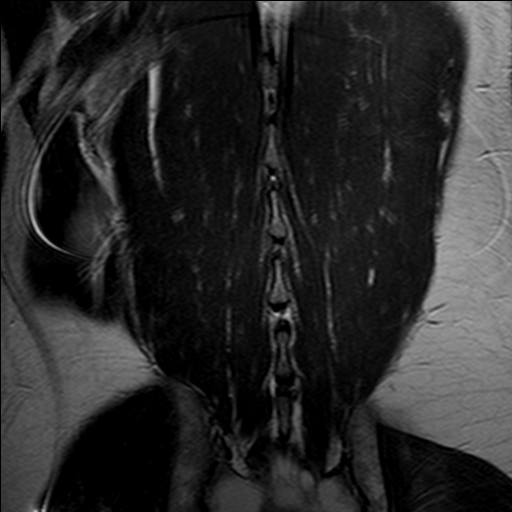
[im 11/24]
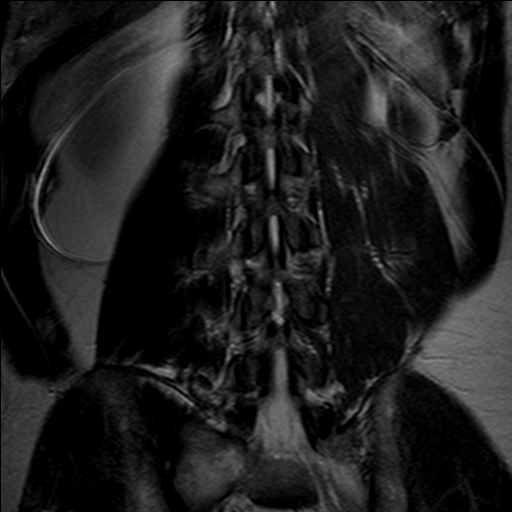
[im 13/24]
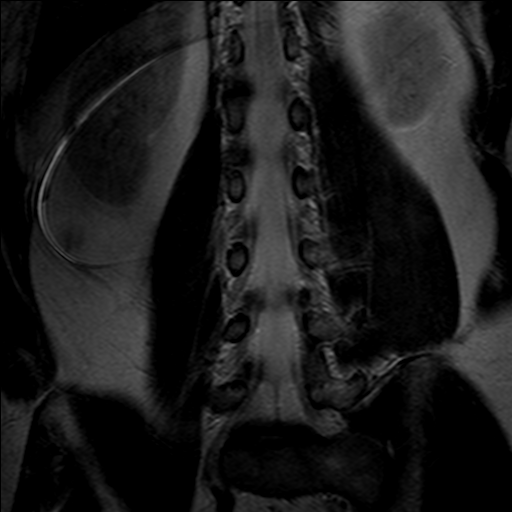
[im 16/24]
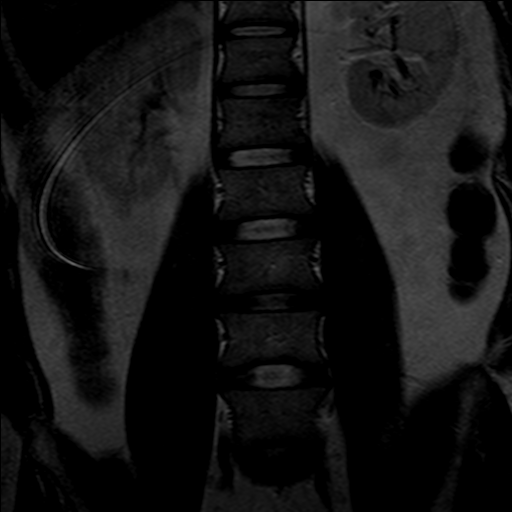
[im 21/24]
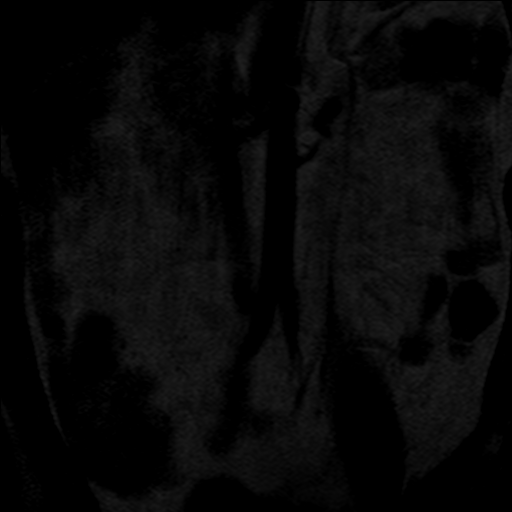
[im 24/24]
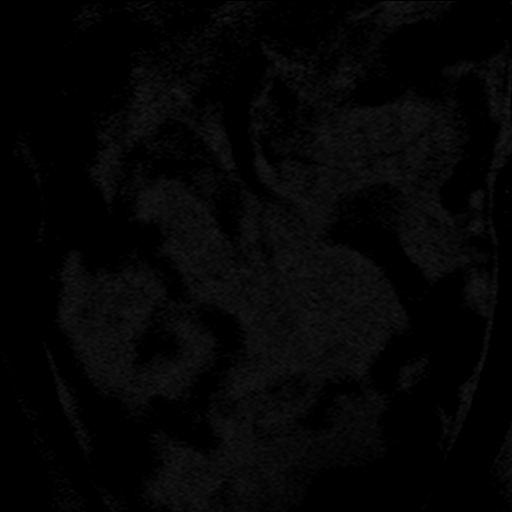

[38 of 48 positions shown; findings below may reference images not displayed]

Técnica:
Exame realizado com sequências ponderadas em T1 e T2, sem a administração intravenosa do agente de
contraste paramagnético.
Relatório:
Retificação da lordose lombar fisiológica.
RESSONÂNCIA MAGNÉTICA DA COLUNA LOMBOSSACRA
O canal vertebral exibe dimensões normais por toda extensão avaliada.
Os corpos vertebrais apresentam altura e alinhamento posterior preservados.
Abaulamento discal difuso no nível de L3-L4, com componente assimétrico centro-lateral à esquerda, que
toca as raízes descendentes intracanais à esquerda de L4, reduzindo ainda as respectivas bases foraminais. 
Não há evidência de outras protrusões discais significativas nos interespaços discais analisados, sejam elas
focais ou difusas.
Articulações interfacetarias sem alterações significativas.
Forames de conjugação livres.
O cone medular é tópico, sendo de calibre e intensidade de sinal normais.
Raízes nervosas da cauda eqüina de morfologia e distribuição anatômica.
Impressão:
Abaulamento discal difuso no nível de L3-L4, com componente assimétrico centro-lateral à esquerda, que
toca as raízes descendentes intracanais à esquerda de L4, reduzindo ainda as respectivas bases foraminais.
Restante do exame dentro dos limites da normalidade.
